# Patient Record
Sex: Female | Born: 1970 | Race: White | Hispanic: No | Marital: Single | Smoking: Never smoker
Health system: Southern US, Community
[De-identification: ages and names within clinical notes are randomized; demographics above are authoritative.]

## PROBLEM LIST (undated history)

## (undated) HISTORY — PX: TONSILLECTOMY AND ADENOIDECTOMY: SUR1326

---

## 2013-08-03 ENCOUNTER — Ambulatory Visit: Payer: Self-pay | Admitting: Family Medicine

## 2013-08-07 ENCOUNTER — Ambulatory Visit: Payer: Self-pay | Admitting: Family Medicine

## 2013-08-21 ENCOUNTER — Other Ambulatory Visit (HOSPITAL_COMMUNITY)
Admission: RE | Admit: 2013-08-21 | Discharge: 2013-08-21 | Disposition: A | Discharge status: Home / Self Care | Payer: BC Managed Care – PPO | Source: Ambulatory Visit | Attending: Family Medicine | Admitting: Family Medicine

## 2013-08-21 ENCOUNTER — Encounter: Payer: Self-pay | Admitting: Family Medicine

## 2013-08-21 ENCOUNTER — Ambulatory Visit (INDEPENDENT_AMBULATORY_CARE_PROVIDER_SITE_OTHER): Payer: BC Managed Care – PPO | Admitting: Family Medicine

## 2013-08-21 VITALS — BP 104/66 | HR 66 | Temp 98.3°F | Ht 60.0 in | Wt 136.6 lb

## 2013-08-21 DIAGNOSIS — Z124 Encounter for screening for malignant neoplasm of cervix: Secondary | ICD-10-CM

## 2013-08-21 DIAGNOSIS — Z1239 Encounter for other screening for malignant neoplasm of breast: Secondary | ICD-10-CM

## 2013-08-21 DIAGNOSIS — Z1151 Encounter for screening for human papillomavirus (HPV): Secondary | ICD-10-CM | POA: Insufficient documentation

## 2013-08-21 DIAGNOSIS — Z0000 Encounter for general adult medical examination without abnormal findings: Secondary | ICD-10-CM

## 2013-08-21 DIAGNOSIS — Z23 Encounter for immunization: Secondary | ICD-10-CM

## 2013-08-21 NOTE — Progress Notes (Signed)
Pre visit review using our clinic review tool, if applicable. No additional management support is needed unless otherwise documented below in the visit note. 

## 2013-08-21 NOTE — Progress Notes (Signed)
Subjective:     Julie Duarte is a 43 y.o. female and is here for a comprehensive physical exam. The patient reports problems - R knee pain-- no known injury.  History   Social History  . Marital Status: Single    Spouse Name: N/A    Number of Children: N/A  . Years of Education: N/A   Occupational History  .      [employer] agency--- [employer]   Social History Main Topics  . Smoking status: Never Smoker   . Smokeless tobacco: Not on file  . Alcohol Use: Yes     Comment: rare  . Drug Use: No  . Sexual Activity: Yes    Partners: Male    Birth Control/ Protection: None   Other Topics Concern  . Not on file   Social History Narrative   Exercise--  Walks dog,   There are no preventive care reminders to display for this patient.  The following portions of the patient's history were reviewed and updated as appropriate:  She  has no past medical history on file. She  does not have a problem list on file. She  has past surgical history that includes Tonsillectomy and adenoidectomy. Her family history includes Cancer (age of onset: 19) in her maternal grandmother. She  reports that she has never smoked. She does not have any smokeless tobacco history on file. She reports that she drinks alcohol. She reports that she does not use illicit drugs. She currently has no medications in their medication list. No current outpatient prescriptions on file prior to visit.   No current facility-administered medications on file prior to visit.   She has No Known Allergies..  Review of Systems Review of Systems  Constitutional: Negative for activity change, appetite change and fatigue.  HENT: Negative for hearing loss, congestion, tinnitus and ear discharge.  dentist q79m Eyes: Negative for visual disturbance (see optho q1y -- vision corrected to 20/20 with glasses).  Respiratory: Negative for cough, chest tightness and shortness of breath.   Cardiovascular: Negative for chest pain,  palpitations and leg swelling.  Gastrointestinal: Negative for abdominal pain, diarrhea, constipation and abdominal distention.  Genitourinary: Negative for urgency, frequency, decreased urine volume and difficulty urinating.  Musculoskeletal: Negative for back pain, arthralgias and gait problem.  Skin: Negative for color change, pallor and rash.  Neurological: Negative for dizziness, light-headedness, numbness and headaches.  Hematological: Negative for adenopathy. Does not bruise/bleed easily.  Psychiatric/Behavioral: Negative for suicidal ideas, confusion, sleep disturbance, self-injury, dysphoric mood, decreased concentration and agitation.       Objective:    BP 104/66  Pulse 66  Temp(Src) 98.3 F (36.8 C) (Oral)  Ht 5' (1.524 m)  Wt 136 lb 9.6 oz (61.961 kg)  BMI 26.68 kg/m2  SpO2 97% General appearance: alert, cooperative, appears stated age and no distress Head: Normocephalic, without obvious abnormality, atraumatic Eyes: conjunctivae/corneas clear. PERRL, EOM's intact. Fundi benign. Ears: normal TM&#39;s and external ear canals both ears Nose: Nares normal. Septum midline. Mucosa normal. No drainage or sinus tenderness. Throat: lips, mucosa, and tongue normal; teeth and gums normal Neck: no adenopathy, no carotid bruit, no JVD, supple, symmetrical, trachea midline and thyroid not enlarged, symmetric, no tenderness/mass/nodules Back: symmetric, no curvature. ROM normal. No CVA tenderness. Lungs: clear to auscultation bilaterally Breasts: normal appearance, no masses or tenderness Heart: regular rate and rhythm, S1, S2 normal, no murmur, click, rub or gallop Abdomen: soft, non-tender; bowel sounds normal; no masses,  no organomegaly Pelvic: cervix  normal in appearance, external genitalia normal, no adnexal masses or tenderness, no cervical motion tenderness, rectovaginal septum normal, uterus normal size, shape, and consistency, vagina normal without discharge and pap done--  + on period Rectal-- heme neg brown stool Extremities: extremities normal, atraumatic, no cyanosis or edema Pulses: 2+ and symmetric Skin: Skin color, texture, turgor normal. No rashes or lesions Lymph nodes: Cervical, supraclavicular, and axillary nodes normal. Neurologic: Alert and oriented X 3, normal strength and tone. Normal symmetric reflexes. Normal coordination and gait Psych-- no depression, no anxiety Assessment:    Healthy female exam.      Plan:    ghm utd Check labs See After Visit Summary for Counseling Recommendations   1. Other screening breast examination  - MM DIGITAL SCREENING BILATERAL; Future  2. Preventative health care colonoscopy - Ambulatory referral to Gastroenterology Labs ordered for future

## 2013-08-21 NOTE — Patient Instructions (Signed)
Preventive Care for Adults A healthy lifestyle and preventive care can promote health and wellness. Preventive health guidelines for women include the following key practices.  A routine yearly physical is a good way to check with your health care provider about your health and preventive screening. It is a chance to share any concerns and updates on your health and to receive a thorough exam.  Visit your dentist for a routine exam and preventive care every 6 months. Brush your teeth twice a day and floss once a day. Good oral hygiene prevents tooth decay and gum disease.  The frequency of eye exams is based on your age, health, family medical history, use of contact lenses, and other factors. Follow your health care provider's recommendations for frequency of eye exams.  Eat a healthy diet. Foods like vegetables, fruits, whole grains, low-fat dairy products, and lean protein foods contain the nutrients you need without too many calories. Decrease your intake of foods high in solid fats, added sugars, and salt. Eat the right amount of calories for you.Get information about a proper diet from your health care provider, if necessary.  Regular physical exercise is one of the most important things you can do for your health. Most adults should get at least 150 minutes of moderate-intensity exercise (any activity that increases your heart rate and causes you to sweat) each week. In addition, most adults need muscle-strengthening exercises on 2 or more days a week.  Maintain a healthy weight. The body mass index (BMI) is a screening tool to identify possible weight problems. It provides an estimate of body fat based on height and weight. Your health care provider can find your BMI and can help you achieve or maintain a healthy weight.For adults 20 years and older:  A BMI below 18.5 is considered underweight.  A BMI of 18.5 to 24.9 is normal.  A BMI of 25 to 29.9 is considered overweight.  A BMI of  30 and above is considered obese.  Maintain normal blood lipids and cholesterol levels by exercising and minimizing your intake of saturated fat. Eat a balanced diet with plenty of fruit and vegetables. Blood tests for lipids and cholesterol should begin at age 76 and be repeated every 5 years. If your lipid or cholesterol levels are high, you are over 50, or you are at high risk for heart disease, you may need your cholesterol levels checked more frequently.Ongoing high lipid and cholesterol levels should be treated with medicines if diet and exercise are not working.  If you smoke, find out from your health care provider how to quit. If you do not use tobacco, do not start.  Lung cancer screening is recommended for adults aged 22-80 years who are at high risk for developing lung cancer because of a history of smoking. A yearly low-dose CT scan of the lungs is recommended for people who have at least a 30-pack-year history of smoking and are a current smoker or have quit within the past 15 years. A pack year of smoking is smoking an average of 1 pack of cigarettes a day for 1 year (for example: 1 pack a day for 30 years or 2 packs a day for 15 years). Yearly screening should continue until the smoker has stopped smoking for at least 15 years. Yearly screening should be stopped for people who develop a health problem that would prevent them from having lung cancer treatment.  If you are pregnant, do not drink alcohol. If you are breastfeeding,  be very cautious about drinking alcohol. If you are not pregnant and choose to drink alcohol, do not have more than 1 drink per day. One drink is considered to be 12 ounces (355 mL) of beer, 5 ounces (148 mL) of wine, or 1.5 ounces (44 mL) of liquor.  Avoid use of street drugs. Do not share needles with anyone. Ask for help if you need support or instructions about stopping the use of drugs.  High blood pressure causes heart disease and increases the risk of  stroke. Your blood pressure should be checked at least every 1 to 2 years. Ongoing high blood pressure should be treated with medicines if weight loss and exercise do not work.  If you are 75-52 years old, ask your health care provider if you should take aspirin to prevent strokes.  Diabetes screening involves taking a blood sample to check your fasting blood sugar level. This should be done once every 3 years, after age 15, if you are within normal weight and without risk factors for diabetes. Testing should be considered at a younger age or be carried out more frequently if you are overweight and have at least 1 risk factor for diabetes.  Breast cancer screening is essential preventive care for women. You should practice "breast self-awareness." This means understanding the normal appearance and feel of your breasts and may include breast self-examination. Any changes detected, no matter how small, should be reported to a health care provider. Women in their 58s and 30s should have a clinical breast exam (CBE) by a health care provider as part of a regular health exam every 1 to 3 years. After age 16, women should have a CBE every year. Starting at age 53, women should consider having a mammogram (breast X-ray test) every year. Women who have a family history of breast cancer should talk to their health care provider about genetic screening. Women at a high risk of breast cancer should talk to their health care providers about having an MRI and a mammogram every year.  Breast cancer gene (BRCA)-related cancer risk assessment is recommended for women who have family members with BRCA-related cancers. BRCA-related cancers include breast, ovarian, tubal, and peritoneal cancers. Having family members with these cancers may be associated with an increased risk for harmful changes (mutations) in the breast cancer genes BRCA1 and BRCA2. Results of the assessment will determine the need for genetic counseling and  BRCA1 and BRCA2 testing.  Routine pelvic exams to screen for cancer are no longer recommended for nonpregnant women who are considered low risk for cancer of the pelvic organs (ovaries, uterus, and vagina) and who do not have symptoms. Ask your health care provider if a screening pelvic exam is right for you.  If you have had past treatment for cervical cancer or a condition that could lead to cancer, you need Pap tests and screening for cancer for at least 20 years after your treatment. If Pap tests have been discontinued, your risk factors (such as having a new sexual partner) need to be reassessed to determine if screening should be resumed. Some women have medical problems that increase the chance of getting cervical cancer. In these cases, your health care provider may recommend more frequent screening and Pap tests.  The HPV test is an additional test that may be used for cervical cancer screening. The HPV test looks for the virus that can cause the cell changes on the cervix. The cells collected during the Pap test can be  tested for HPV. The HPV test could be used to screen women aged 70 years and older, and should be used in women of any age who have unclear Pap test results. After the age of 77, women should have HPV testing at the same frequency as a Pap test.  Colorectal cancer can be detected and often prevented. Most routine colorectal cancer screening begins at the age of 13 years and continues through age 47 years. However, your health care provider may recommend screening at an earlier age if you have risk factors for colon cancer. On a yearly basis, your health care provider may provide home test kits to check for hidden blood in the stool. Use of a small camera at the end of a tube, to directly examine the colon (sigmoidoscopy or colonoscopy), can detect the earliest forms of colorectal cancer. Talk to your health care provider about this at age 45, when routine screening begins. Direct  exam of the colon should be repeated every 5-10 years through age 58 years, unless early forms of pre-cancerous polyps or small growths are found.  People who are at an increased risk for hepatitis B should be screened for this virus. You are considered at high risk for hepatitis B if:  You were born in a country where hepatitis B occurs often. Talk with your health care provider about which countries are considered high risk.  Your parents were born in a high-risk country and you have not received a shot to protect against hepatitis B (hepatitis B vaccine).  You have HIV or AIDS.  You use needles to inject street drugs.  You live with, or have sex with, someone who has hepatitis B.  You get hemodialysis treatment.  You take certain medicines for conditions like cancer, organ transplantation, and autoimmune conditions.  Hepatitis C blood testing is recommended for all people born from [birth year] through 1965 and any individual with known risks for hepatitis C.  Practice safe sex. Use condoms and avoid high-risk sexual practices to reduce the spread of sexually transmitted infections (STIs). STIs include gonorrhea, chlamydia, syphilis, trichomonas, herpes, HPV, and human immunodeficiency virus (HIV). Herpes, HIV, and HPV are viral illnesses that have no cure. They can result in disability, cancer, and death.  You should be screened for sexually transmitted illnesses (STIs) including gonorrhea and chlamydia if:  You are sexually active and are younger than 24 years.  You are older than 24 years and your health care provider tells you that you are at risk for this type of infection.  Your sexual activity has changed since you were last screened and you are at an increased risk for chlamydia or gonorrhea. Ask your health care provider if you are at risk.  If you are at risk of being infected with HIV, it is recommended that you take a prescription medicine daily to prevent HIV infection. This is  called preexposure prophylaxis (PrEP). You are considered at risk if:  You are a heterosexual woman, are sexually active, and are at increased risk for HIV infection.  You take drugs by injection.  You are sexually active with a partner who has HIV.  Talk with your health care provider about whether you are at high risk of being infected with HIV. If you choose to begin PrEP, you should first be tested for HIV. You should then be tested every 3 months for as long as you are taking PrEP.  Osteoporosis is a disease in which the bones lose minerals and strength  with aging. This can result in serious bone fractures or breaks. The risk of osteoporosis can be identified using a bone density scan. Women ages 65 years and over and women at risk for fractures or osteoporosis should discuss screening with their health care providers. Ask your health care provider whether you should take a calcium supplement or vitamin D to reduce the rate of osteoporosis.  Menopause can be associated with physical symptoms and risks. Hormone replacement therapy is available to decrease symptoms and risks. You should talk to your health care provider about whether hormone replacement therapy is right for you.  Use sunscreen. Apply sunscreen liberally and repeatedly throughout the day. You should seek shade when your shadow is shorter than you. Protect yourself by wearing long sleeves, pants, a wide-brimmed hat, and sunglasses year round, whenever you are outdoors.  Once a month, do a whole body skin exam, using a mirror to look at the skin on your back. Tell your health care provider of new moles, moles that have irregular borders, moles that are larger than a pencil eraser, or moles that have changed in shape or color.  Stay current with required vaccines (immunizations).  Influenza vaccine. All adults should be immunized every year.  Tetanus, diphtheria, and acellular pertussis (Td, Tdap) vaccine. Pregnant women should  receive 1 dose of Tdap vaccine during each pregnancy. The dose should be obtained regardless of the length of time since the last dose. Immunization is preferred during the 27th-36th week of gestation. An adult who has not previously received Tdap or who does not know her vaccine status should receive 1 dose of Tdap. This initial dose should be followed by tetanus and diphtheria toxoids (Td) booster doses every 10 years. Adults with an unknown or incomplete history of completing a 3-dose immunization series with Td-containing vaccines should begin or complete a primary immunization series including a Tdap dose. Adults should receive a Td booster every 10 years.  Varicella vaccine. An adult without evidence of immunity to varicella should receive 2 doses or a second dose if she has previously received 1 dose. Pregnant females who do not have evidence of immunity should receive the first dose after pregnancy. This first dose should be obtained before leaving the health care facility. The second dose should be obtained 4-8 weeks after the first dose.  Human papillomavirus (HPV) vaccine. Females aged 13-26 years who have not received the vaccine previously should obtain the 3-dose series. The vaccine is not recommended for use in pregnant females. However, pregnancy testing is not needed before receiving a dose. If a female is found to be pregnant after receiving a dose, no treatment is needed. In that case, the remaining doses should be delayed until after the pregnancy. Immunization is recommended for any person with an immunocompromised condition through the age of 26 years if she did not get any or all doses earlier. During the 3-dose series, the second dose should be obtained 4-8 weeks after the first dose. The third dose should be obtained 24 weeks after the first dose and 16 weeks after the second dose.  Zoster vaccine. One dose is recommended for adults aged 60 years or older unless certain conditions are  present.  Measles, mumps, and rubella (MMR) vaccine. Adults born before 1957 generally are considered immune to measles and mumps. Adults born in 1957 or later should have 1 or more doses of MMR vaccine unless there is a contraindication to the vaccine or there is laboratory evidence of immunity to   each of the three diseases. A routine second dose of MMR vaccine should be obtained at least 28 days after the first dose for students attending postsecondary schools, health care workers, or international travelers. People who received inactivated measles vaccine or an unknown type of measles vaccine during 1963-1967 should receive 2 doses of MMR vaccine. People who received inactivated mumps vaccine or an unknown type of mumps vaccine before 1979 and are at high risk for mumps infection should consider immunization with 2 doses of MMR vaccine. For females of childbearing age, rubella immunity should be determined. If there is no evidence of immunity, females who are not pregnant should be vaccinated. If there is no evidence of immunity, females who are pregnant should delay immunization until after pregnancy. Unvaccinated health care workers born before 1957 who lack laboratory evidence of measles, mumps, or rubella immunity or laboratory confirmation of disease should consider measles and mumps immunization with 2 doses of MMR vaccine or rubella immunization with 1 dose of MMR vaccine.  Pneumococcal 13-valent conjugate (PCV13) vaccine. When indicated, a person who is uncertain of her immunization history and has no record of immunization should receive the PCV13 vaccine. An adult aged 19 years or older who has certain medical conditions and has not been previously immunized should receive 1 dose of PCV13 vaccine. This PCV13 should be followed with a dose of pneumococcal polysaccharide (PPSV23) vaccine. The PPSV23 vaccine dose should be obtained at least 8 weeks after the dose of PCV13 vaccine. An adult aged 19  years or older who has certain medical conditions and previously received 1 or more doses of PPSV23 vaccine should receive 1 dose of PCV13. The PCV13 vaccine dose should be obtained 1 or more years after the last PPSV23 vaccine dose.  Pneumococcal polysaccharide (PPSV23) vaccine. When PCV13 is also indicated, PCV13 should be obtained first. All adults aged 65 years and older should be immunized. An adult younger than age 65 years who has certain medical conditions should be immunized. Any person who resides in a nursing home or long-term care facility should be immunized. An adult smoker should be immunized. People with an immunocompromised condition and certain other conditions should receive both PCV13 and PPSV23 vaccines. People with human immunodeficiency virus (HIV) infection should be immunized as soon as possible after diagnosis. Immunization during chemotherapy or radiation therapy should be avoided. Routine use of PPSV23 vaccine is not recommended for American Indians, [place] Natives, or people younger than 65 years unless there are medical conditions that require PPSV23 vaccine. When indicated, people who have unknown immunization and have no record of immunization should receive PPSV23 vaccine. One-time revaccination 5 years after the first dose of PPSV23 is recommended for people aged 19-64 years who have chronic kidney failure, nephrotic syndrome, asplenia, or immunocompromised conditions. People who received 1-2 doses of PPSV23 before age 65 years should receive another dose of PPSV23 vaccine at age 65 years or later if at least 5 years have passed since the previous dose. Doses of PPSV23 are not needed for people immunized with PPSV23 at or after age 65 years.  Meningococcal vaccine. Adults with asplenia or persistent complement component deficiencies should receive 2 doses of quadrivalent meningococcal conjugate (MenACWY-D) vaccine. The doses should be obtained at least 2 months apart.  Microbiologists working with certain meningococcal bacteria, military recruits, people at risk during an outbreak, and people who travel to or live in countries with a high rate of meningitis should be immunized. A first-year college student up through age   21 years who is living in a residence hall should receive a dose if she did not receive a dose on or after her 16th birthday. Adults who have certain high-risk conditions should receive one or more doses of vaccine.  Hepatitis A vaccine. Adults who wish to be protected from this disease, have certain high-risk conditions, work with hepatitis A-infected animals, work in hepatitis A research labs, or travel to or work in countries with a high rate of hepatitis A should be immunized. Adults who were previously unvaccinated and who anticipate close contact with an international adoptee during the first 60 days after arrival in the [place] from a country with a high rate of hepatitis A should be immunized.  Hepatitis B vaccine. Adults who wish to be protected from this disease, have certain high-risk conditions, may be exposed to blood or other infectious body fluids, are household contacts or sex partners of hepatitis B positive people, are clients or workers in certain care facilities, or travel to or work in countries with a high rate of hepatitis B should be immunized.  Haemophilus influenzae type b (Hib) vaccine. A previously unvaccinated person with asplenia or sickle cell disease or having a scheduled splenectomy should receive 1 dose of Hib vaccine. Regardless of previous immunization, a recipient of a hematopoietic stem cell transplant should receive a 3-dose series 6-12 months after her successful transplant. Hib vaccine is not recommended for adults with HIV infection. Preventive Services / Frequency Ages 43 to 58 years  Blood pressure check.** / Every 1 to 2 years.  Lipid and cholesterol check.** / Every 5 years beginning at age  51.  Clinical breast exam.** / Every 3 years for women in their 110s and 86s.  BRCA-related cancer risk assessment.** / For women who have family members with a BRCA-related cancer (breast, ovarian, tubal, or peritoneal cancers).  Pap test.** / Every 2 years from ages 6 through 43. Every 3 years starting at age 68 through age 50 or 48 with a history of 3 consecutive normal Pap tests.  HPV screening.** / Every 3 years from ages 30 through ages 57 to 110 with a history of 3 consecutive normal Pap tests.  Hepatitis C blood test.** / For any individual with known risks for hepatitis C.  Skin self-exam. / Monthly.  Influenza vaccine. / Every year.  Tetanus, diphtheria, and acellular pertussis (Tdap, Td) vaccine.** / Consult your health care provider. Pregnant women should receive 1 dose of Tdap vaccine during each pregnancy. 1 dose of Td every 10 years.  Varicella vaccine.** / Consult your health care provider. Pregnant females who do not have evidence of immunity should receive the first dose after pregnancy.  HPV vaccine. / 3 doses over 6 months, if 75 and younger. The vaccine is not recommended for use in pregnant females. However, pregnancy testing is not needed before receiving a dose.  Measles, mumps, rubella (MMR) vaccine.** / You need at least 1 dose of MMR if you were born in 1957 or later. You may also need a 2nd dose. For females of childbearing age, rubella immunity should be determined. If there is no evidence of immunity, females who are not pregnant should be vaccinated. If there is no evidence of immunity, females who are pregnant should delay immunization until after pregnancy.  Pneumococcal 13-valent conjugate (PCV13) vaccine.** / Consult your health care provider.  Pneumococcal polysaccharide (PPSV23) vaccine.** / 1 to 2 doses if you smoke cigarettes or if you have certain conditions.  Meningococcal vaccine.** /  1 dose if you are age 19 to 21 years and a first-year college  student living in a residence hall, or have one of several medical conditions, you need to get vaccinated against meningococcal disease. You may also need additional booster doses.  Hepatitis A vaccine.** / Consult your health care provider.  Hepatitis B vaccine.** / Consult your health care provider.  Haemophilus influenzae type b (Hib) vaccine.** / Consult your health care provider. Ages 40 to 64 years  Blood pressure check.** / Every 1 to 2 years.  Lipid and cholesterol check.** / Every 5 years beginning at age 20 years.  Lung cancer screening. / Every year if you are aged 55-80 years and have a 30-pack-year history of smoking and currently smoke or have quit within the past 15 years. Yearly screening is stopped once you have quit smoking for at least 15 years or develop a health problem that would prevent you from having lung cancer treatment.  Clinical breast exam.** / Every year after age 40 years.  BRCA-related cancer risk assessment.** / For women who have family members with a BRCA-related cancer (breast, ovarian, tubal, or peritoneal cancers).  Mammogram.** / Every year beginning at age 40 years and continuing for as long as you are in good health. Consult with your health care provider.  Pap test.** / Every 3 years starting at age 30 years through age 65 or 70 years with a history of 3 consecutive normal Pap tests.  HPV screening.** / Every 3 years from ages 30 years through ages 65 to 70 years with a history of 3 consecutive normal Pap tests.  Fecal occult blood test (FOBT) of stool. / Every year beginning at age 50 years and continuing until age 75 years. You may not need to do this test if you get a colonoscopy every 10 years.  Flexible sigmoidoscopy or colonoscopy.** / Every 5 years for a flexible sigmoidoscopy or every 10 years for a colonoscopy beginning at age 50 years and continuing until age 75 years.  Hepatitis C blood test.** / For all people born from 1945 through  1965 and any individual with known risks for hepatitis C.  Skin self-exam. / Monthly.  Influenza vaccine. / Every year.  Tetanus, diphtheria, and acellular pertussis (Tdap/Td) vaccine.** / Consult your health care provider. Pregnant women should receive 1 dose of Tdap vaccine during each pregnancy. 1 dose of Td every 10 years.  Varicella vaccine.** / Consult your health care provider. Pregnant females who do not have evidence of immunity should receive the first dose after pregnancy.  Zoster vaccine.** / 1 dose for adults aged 60 years or older.  Measles, mumps, rubella (MMR) vaccine.** / You need at least 1 dose of MMR if you were born in 1957 or later. You may also need a 2nd dose. For females of childbearing age, rubella immunity should be determined. If there is no evidence of immunity, females who are not pregnant should be vaccinated. If there is no evidence of immunity, females who are pregnant should delay immunization until after pregnancy.  Pneumococcal 13-valent conjugate (PCV13) vaccine.** / Consult your health care provider.  Pneumococcal polysaccharide (PPSV23) vaccine.** / 1 to 2 doses if you smoke cigarettes or if you have certain conditions.  Meningococcal vaccine.** / Consult your health care provider.  Hepatitis A vaccine.** / Consult your health care provider.  Hepatitis B vaccine.** / Consult your health care provider.  Haemophilus influenzae type b (Hib) vaccine.** / Consult your health care provider. Ages 65   years and over  Blood pressure check.** / Every 1 to 2 years.  Lipid and cholesterol check.** / Every 5 years beginning at age 35 years.  Lung cancer screening. / Every year if you are aged 37-80 years and have a 30-pack-year history of smoking and currently smoke or have quit within the past 15 years. Yearly screening is stopped once you have quit smoking for at least 15 years or develop a health problem that would prevent you from having lung cancer  treatment.  Clinical breast exam.** / Every year after age 18 years.  BRCA-related cancer risk assessment.** / For women who have family members with a BRCA-related cancer (breast, ovarian, tubal, or peritoneal cancers).  Mammogram.** / Every year beginning at age 47 years and continuing for as long as you are in good health. Consult with your health care provider.  Pap test.** / Every 3 years starting at age 63 years through age 85 or 75 years with 3 consecutive normal Pap tests. Testing can be stopped between 65 and 70 years with 3 consecutive normal Pap tests and no abnormal Pap or HPV tests in the past 10 years.  HPV screening.** / Every 3 years from ages 69 years through ages 16 or 53 years with a history of 3 consecutive normal Pap tests. Testing can be stopped between 65 and 70 years with 3 consecutive normal Pap tests and no abnormal Pap or HPV tests in the past 10 years.  Fecal occult blood test (FOBT) of stool. / Every year beginning at age 22 years and continuing until age 36 years. You may not need to do this test if you get a colonoscopy every 10 years.  Flexible sigmoidoscopy or colonoscopy.** / Every 5 years for a flexible sigmoidoscopy or every 10 years for a colonoscopy beginning at age 61 years and continuing until age 53 years.  Hepatitis C blood test.** / For all people born from [birth year] through 1965 and any individual with known risks for hepatitis C.  Osteoporosis screening.** / A one-time screening for women ages 6 years and over and women at risk for fractures or osteoporosis.  Skin self-exam. / Monthly.  Influenza vaccine. / Every year.  Tetanus, diphtheria, and acellular pertussis (Tdap/Td) vaccine.** / 1 dose of Td every 10 years.  Varicella vaccine.** / Consult your health care provider.  Zoster vaccine.** / 1 dose for adults aged 82 years or older.  Pneumococcal 13-valent conjugate (PCV13) vaccine.** / Consult your health care provider.  Pneumococcal  polysaccharide (PPSV23) vaccine.** / 1 dose for all adults aged 85 years and older.  Meningococcal vaccine.** / Consult your health care provider.  Hepatitis A vaccine.** / Consult your health care provider.  Hepatitis B vaccine.** / Consult your health care provider.  Haemophilus influenzae type b (Hib) vaccine.** / Consult your health care provider. ** Family history and personal history of risk and conditions may change your health care provider's recommendations. Document Released: 03/03/2001 Document Revised: 05/22/2013 Document Reviewed: 06/02/2010 [facility] Patient Information 2015 [place], [place]. This information is not intended to replace advice given to you by your health care provider. Make sure you discuss any questions you have with your health care provider.

## 2013-08-24 LAB — CYTOLOGY - PAP

## 2013-08-25 ENCOUNTER — Other Ambulatory Visit (INDEPENDENT_AMBULATORY_CARE_PROVIDER_SITE_OTHER): Payer: BC Managed Care – PPO

## 2013-08-25 DIAGNOSIS — Z1239 Encounter for other screening for malignant neoplasm of breast: Secondary | ICD-10-CM

## 2013-08-25 DIAGNOSIS — Z0000 Encounter for general adult medical examination without abnormal findings: Secondary | ICD-10-CM

## 2013-08-25 LAB — POCT URINALYSIS DIPSTICK
Bilirubin, UA: NEGATIVE
Blood, UA: NEGATIVE
Glucose, UA: NEGATIVE
KETONES UA: NEGATIVE
LEUKOCYTES UA: NEGATIVE
NITRITE UA: NEGATIVE
PH UA: 6
Protein, UA: NEGATIVE
Spec Grav, UA: <=1.005
Urobilinogen, UA: 0.2

## 2013-08-25 LAB — CBC WITH DIFFERENTIAL/PLATELET
BASOS ABS: 0 10*3/uL (ref 0.0–0.1)
Basophils Relative: 0.8 % (ref 0.0–3.0)
EOS PCT: 3.8 % (ref 0.0–5.0)
Eosinophils Absolute: 0.2 10*3/uL (ref 0.0–0.7)
HEMATOCRIT: 37.3 % (ref 36.0–46.0)
HEMOGLOBIN: 12.6 g/dL (ref 12.0–15.0)
LYMPHS ABS: 1.7 10*3/uL (ref 0.7–4.0)
Lymphocytes Relative: 31.2 % (ref 12.0–46.0)
MCHC: 33.9 g/dL (ref 30.0–36.0)
MCV: 93 fl (ref 78.0–100.0)
Monocytes Absolute: 0.6 10*3/uL (ref 0.1–1.0)
Monocytes Relative: 11.6 % (ref 3.0–12.0)
NEUTROS PCT: 52.6 % (ref 43.0–77.0)
Neutro Abs: 2.8 10*3/uL (ref 1.4–7.7)
PLATELETS: 347 10*3/uL (ref 150.0–400.0)
RBC: 4.01 Mil/uL (ref 3.87–5.11)
RDW: 13.9 % (ref 11.5–15.5)
WBC: 5.3 10*3/uL (ref 4.0–10.5)

## 2013-08-25 LAB — LIPID PANEL
CHOLESTEROL: 185 mg/dL (ref 0–200)
HDL: 57.8 mg/dL (ref >39.00)
LDL Cholesterol: 117 mg/dL — ABNORMAL HIGH (ref 0–99)
NonHDL: 127.2
TRIGLYCERIDES: 51 mg/dL (ref 0.0–149.0)
Total CHOL/HDL Ratio: 3
VLDL: 10.2 mg/dL (ref 0.0–40.0)

## 2013-08-25 LAB — BASIC METABOLIC PANEL
BUN: 12 mg/dL (ref 6–23)
CALCIUM: 8.9 mg/dL (ref 8.4–10.5)
CO2: 28 meq/L (ref 19–32)
Chloride: 103 mEq/L (ref 96–112)
Creatinine, Ser: 0.8 mg/dL (ref 0.4–1.2)
GFR: 84.76 mL/min (ref >60.00)
GLUCOSE: 81 mg/dL (ref 70–99)
Potassium: 3.7 mEq/L (ref 3.5–5.1)
Sodium: 135 mEq/L (ref 135–145)

## 2013-08-25 LAB — HEPATIC FUNCTION PANEL
ALBUMIN: 3.8 g/dL (ref 3.5–5.2)
ALT: 10 U/L (ref 0–35)
AST: 16 U/L (ref 0–37)
Alkaline Phosphatase: 44 U/L (ref 39–117)
Bilirubin, Direct: 0 mg/dL (ref 0.0–0.3)
TOTAL PROTEIN: 6.7 g/dL (ref 6.0–8.3)
Total Bilirubin: 0.3 mg/dL (ref 0.2–1.2)

## 2013-08-25 LAB — TSH: TSH: 1.22 u[IU]/mL (ref 0.35–4.50)

## 2013-08-30 ENCOUNTER — Ambulatory Visit (HOSPITAL_BASED_OUTPATIENT_CLINIC_OR_DEPARTMENT_OTHER): Payer: Self-pay

## 2013-11-03 ENCOUNTER — Encounter: Payer: Self-pay | Admitting: Family Medicine

## 2013-11-07 ENCOUNTER — Encounter: Payer: Self-pay | Admitting: Family Medicine

## 2013-12-20 ENCOUNTER — Inpatient Hospital Stay (HOSPITAL_BASED_OUTPATIENT_CLINIC_OR_DEPARTMENT_OTHER): Admission: RE | Admit: 2013-12-20 | Payer: Self-pay | Source: Ambulatory Visit

## 2014-01-23 ENCOUNTER — Ambulatory Visit (HOSPITAL_BASED_OUTPATIENT_CLINIC_OR_DEPARTMENT_OTHER)
Admission: RE | Admit: 2014-01-23 | Discharge: 2014-01-23 | Disposition: A | Discharge status: Home / Self Care | Payer: BLUE CROSS/BLUE SHIELD | Source: Ambulatory Visit | Attending: Family Medicine | Admitting: Family Medicine

## 2014-01-23 ENCOUNTER — Ambulatory Visit (HOSPITAL_BASED_OUTPATIENT_CLINIC_OR_DEPARTMENT_OTHER): Payer: Self-pay

## 2014-01-23 DIAGNOSIS — Z1231 Encounter for screening mammogram for malignant neoplasm of breast: Secondary | ICD-10-CM | POA: Diagnosis present

## 2014-01-24 ENCOUNTER — Encounter: Payer: Self-pay | Admitting: Gastroenterology

## 2014-02-20 ENCOUNTER — Other Ambulatory Visit: Payer: Self-pay | Admitting: Family Medicine

## 2014-02-20 ENCOUNTER — Telehealth: Payer: Self-pay | Admitting: Family Medicine

## 2014-02-20 ENCOUNTER — Encounter: Payer: Self-pay | Admitting: Family Medicine

## 2014-02-20 DIAGNOSIS — Z3009 Encounter for other general counseling and advice on contraception: Secondary | ICD-10-CM

## 2014-02-20 NOTE — Telephone Encounter (Signed)
Please advise      KP 

## 2014-02-20 NOTE — Telephone Encounter (Signed)
Caller name:Inga, Karly Relation to UL:AGTX Call back number:[phone] to leave message Pharmacy:  Reason for call: pt is needing a referral for an GYN- pt has BCBS, pt is trying to get pregnant and want to see a GYN to find out about what options she has.

## 2014-02-20 NOTE — Telephone Encounter (Signed)
Referral iin

## 2014-03-14 ENCOUNTER — Ambulatory Visit: Payer: BLUE CROSS/BLUE SHIELD | Admitting: Gastroenterology

## 2014-04-23 ENCOUNTER — Encounter: Payer: Self-pay | Admitting: Gastroenterology

## 2014-04-23 ENCOUNTER — Ambulatory Visit (INDEPENDENT_AMBULATORY_CARE_PROVIDER_SITE_OTHER): Payer: BLUE CROSS/BLUE SHIELD | Admitting: Gastroenterology

## 2014-04-23 VITALS — BP 100/68 | HR 68

## 2014-04-23 DIAGNOSIS — Z800 Family history of malignant neoplasm of digestive organs: Secondary | ICD-10-CM | POA: Diagnosis not present

## 2014-04-23 DIAGNOSIS — R1314 Dysphagia, pharyngoesophageal phase: Secondary | ICD-10-CM | POA: Diagnosis not present

## 2014-04-23 MED ORDER — PEG-KCL-NACL-NASULF-NA ASC-C 100 G PO SOLR: 1.0000 | Freq: Once | ORAL | Status: DC

## 2014-04-23 NOTE — Patient Instructions (Addendum)
You will be set up for a colonoscopy for FH of colon cancer, colon polyps. You will be set up for an upper endoscopy for dysphagia.  You have been scheduled for an endoscopy and colonoscopy. Please follow the written instructions given to you at your visit today. Please pick up your prep supplies at the pharmacy within the next 1-3 days. If you use inhalers (even only as needed), please bring them with you on the day of your procedure. Your physician has requested that you go to www.startemmi.com and enter the access code given to you at your visit today. This web site gives a general overview about your procedure. However, you should still follow specific instructions given to you by our office regarding your preparation for the procedure.

## 2014-04-23 NOTE — Progress Notes (Signed)
HPI: This is a very pleasant 44 year old woman     who was referred to me by Rosalita Chessman, DO  to evaluate  intermittent dysphasia, family history of colon cancer  Never had colon cancer screening  Mother had diverticulitis.  Maternal GM had colon cancer; was in her 60swhen diagnosed.  Mother had colon polyps.  Has intermittent loose stools, dietary related usually.  Never blood in her stool.  Overall her weight is up in past year  For at least 20 years, hicups with eating starchy foods.  More recently, feels dypshagia (past 6 months).  Was eating fries or rice and food hung in mid esophagus.  Pretty uncommon pyrosis.  Review of systems: Pertinent positive and negative review of systems were noted in the above HPI section. Complete review of systems was performed and was otherwise normal.    History reviewed. No pertinent past medical history.  Past Surgical History  Procedure Laterality Date  . Tonsillectomy and adenoidectomy      No current outpatient prescriptions on file.   No current facility-administered medications for this visit.    Allergies as of 04/23/2014 - Review Complete 04/23/2014  Allergen Reaction Noted  . Penicillins  04/23/2014  . Quinolones  04/23/2014    Family History  Problem Relation Age of Onset  . Breast cancer Maternal Grandmother 90    breast  . Colon cancer Maternal Grandmother     History   Social History  . Marital Status: Single    Spouse Name: N/A  . Number of Children: N/A  . Years of Education: N/A   Occupational History  .      temp agency--- [employer]   Social History Main Topics  . Smoking status: Never Smoker   . Smokeless tobacco: Never Used  . Alcohol Use: 0.0 oz/week    0 Standard drinks or equivalent per week     Comment: rare  . Drug Use: No  . Sexual Activity:    Partners: Male    Birth Control/ Protection: None   Other Topics Concern  . Not on file   Social History Narrative   Exercise--  Building surveyor,       Physical Exam: BP 100/68 mmHg  Pulse 68  LMP 04/08/2014 (Approximate) Constitutional: generally well-appearing Psychiatric: alert and oriented x3 Eyes: extraocular movements intact Mouth: oral pharynx moist, no lesions Neck: supple no lymphadenopathy Cardiovascular: heart regular rate and rhythm Lungs: clear to auscultation bilaterally Abdomen: soft, nontender, nondistended, no obvious ascites, no peritoneal signs, normal bowel sounds Extremities: no lower extremity edema bilaterally Skin: no lesions on visible extremities    Assessment and plan: 44 y.o. female with  intermittent dysphasia, family history of colon cancer, family history of colon polyps  Her grandmother having colon cancer does not significantly increase her own risk but her mother also has had precancerous colon polyps and so those 2 together I do recommend we do colon cancer screening around now. If no precancerous polyps or colon cancer than output her back into routine risk pool. At the same time I'll proceed with EGD given her intermittent solid food dysphagia. She does not really have any GERD symptoms, pyrosis inside not going to recommend proton pump inhibitor at this time.   Cc: Rosalita Chessman, DO

## 2014-04-27 ENCOUNTER — Encounter: Payer: Self-pay | Admitting: Gastroenterology

## 2014-05-01 ENCOUNTER — Encounter: Payer: Self-pay | Admitting: Gastroenterology

## 2014-05-15 NOTE — Telephone Encounter (Signed)
The miralax instructions have been added to My Chart.  Please let me know if you have any further questions

## 2014-05-29 ENCOUNTER — Other Ambulatory Visit: Payer: Self-pay | Admitting: Gastroenterology

## 2014-05-29 ENCOUNTER — Ambulatory Visit (AMBULATORY_SURGERY_CENTER): Payer: BLUE CROSS/BLUE SHIELD | Admitting: Gastroenterology

## 2014-05-29 ENCOUNTER — Encounter: Payer: Self-pay | Admitting: Gastroenterology

## 2014-05-29 VITALS — BP 100/41 | HR 49 | Temp 97.9°F | Resp 17 | Ht 60.0 in | Wt 136.0 lb

## 2014-05-29 DIAGNOSIS — K219 Gastro-esophageal reflux disease without esophagitis: Secondary | ICD-10-CM | POA: Diagnosis not present

## 2014-05-29 DIAGNOSIS — R1314 Dysphagia, pharyngoesophageal phase: Secondary | ICD-10-CM

## 2014-05-29 DIAGNOSIS — K635 Polyp of colon: Secondary | ICD-10-CM

## 2014-05-29 DIAGNOSIS — Z800 Family history of malignant neoplasm of digestive organs: Secondary | ICD-10-CM

## 2014-05-29 DIAGNOSIS — Z1211 Encounter for screening for malignant neoplasm of colon: Secondary | ICD-10-CM | POA: Diagnosis not present

## 2014-05-29 DIAGNOSIS — D123 Benign neoplasm of transverse colon: Secondary | ICD-10-CM

## 2014-05-29 MED ORDER — SODIUM CHLORIDE 0.9 % IV SOLN: 500.0000 mL | INTRAVENOUS | Status: DC

## 2014-05-29 NOTE — Op Note (Signed)
[place]  [address] [place], [address]   ENDOSCOPY PROCEDURE REPORT  PATIENT: Julie Duarte, Julie Duarte  MR#: [id number] BIRTHDATE: 03-14-1970 , 44  yrs. old GENDER: female ENDOSCOPIST: Milus Banister, MD PROCEDURE DATE:  05/29/2014 PROCEDURE:  EGD, diagnostic ASA CLASS:     Class II INDICATIONS:  dysphagia, intermittent reflux. MEDICATIONS: Monitored anesthesia care and Propofol 100 mg IV TOPICAL ANESTHETIC: none  DESCRIPTION OF PROCEDURE: After the risks benefits and alternatives of the procedure were thoroughly explained, informed consent was obtained.  The LB [id number] endoscope was introduced through the mouth and advanced to the second portion of the duodenum , Without limitations.  The instrument was slowly withdrawn as the mucosa was fully examined.   EXAM: The esophagus and gastroesophageal junction were completely normal in appearance.  The stomach was entered and closely examined.The antrum, angularis, and lesser curvature were well visualized, including a retroflexed view of the cardia and fundus. The stomach wall was normally distensable.  The scope passed easily through the pylorus into the duodenum.  Retroflexed views revealed no abnormalities.     The scope was then withdrawn from the patient and the procedure completed. COMPLICATIONS: There were no immediate complications.  ENDOSCOPIC IMPRESSION: Normal appearing esophagus and GE junction, the stomach was well visualized and normal in appearance, normal appearing duodenum  RECOMMENDATIONS: Please start once daily OTC omeprazole and call my office in 5-6 weeks to report on your response.   eSigned:  Milus Banister, MD 05/29/2014 2:08 PM   cc: Garnet Koyanagi, MD

## 2014-05-29 NOTE — Progress Notes (Signed)
Patient awakening,vss,report to rn 

## 2014-05-29 NOTE — Progress Notes (Signed)
Called to room to assist during endoscopic procedure.  Patient ID and intended procedure confirmed with present staff. Received instructions for my participation in the procedure from the performing physician.  

## 2014-05-29 NOTE — Op Note (Signed)
[place]  [address]. [place], [address]   COLONOSCOPY PROCEDURE REPORT  PATIENT: Julie Duarte, Julie Duarte  MR#: [id number] BIRTHDATE: November 30, 1970 , 44  yrs. old GENDER: female ENDOSCOPIST: Milus Banister, MD REFERRED SV:[id number] Osgood, DO PROCEDURE DATE:  05/29/2014 PROCEDURE:   Colonoscopy, screening and Colonoscopy with snare polypectomy First Screening Colonoscopy - Avg.  risk and is 50 yrs.  old or older Yes.  Prior Negative Screening - Now for repeat screening. N/A  History of Adenoma - Now for follow-up colonoscopy & has been > or = to 3 yrs.  N/A ASA CLASS:   Class II INDICATIONS:Screening for colonic neoplasia and FH Colon Adenoma (MGM with colon cancer, M with colon polyps). MEDICATIONS: Monitored anesthesia care and Propofol 200 mg IV  DESCRIPTION OF PROCEDURE:   After the risks benefits and alternatives of the procedure were thoroughly explained, informed consent was obtained.  The digital rectal exam revealed no abnormalities of the rectum.   The LB [id number]  endoscope was introduced through the anus and advanced to the cecum, which was identified by both the appendix and ileocecal valve. No adverse events experienced.   The quality of the prep was excellent.  The instrument was then slowly withdrawn as the colon was fully examined.   COLON FINDINGS: A sessile polyp measuring 4 mm in size was found in the transverse colon.  A polypectomy was performed with a cold snare.  The resection was complete, the polyp tissue was completely retrieved and sent to histology.   The examination was otherwise normal.  Retroflexed views revealed no abnormalities. The time to cecum = 2.9      Withdrawal time = 5.9        The scope was withdrawn and the procedure completed. COMPLICATIONS: There were no immediate complications.  ENDOSCOPIC IMPRESSION: 1.   Sessile polyp was found in the transverse colon; polypectomy was performed with a cold snare 2.   The  examination was otherwise normal  RECOMMENDATIONS: If the polyp(s) removed today are proven to be adenomatous (pre-cancerous) polyps, you will need a repeat colonoscopy in 5 years.  Otherwise you should continue to follow colorectal cancer screening guidelines for "routine risk" patients with colonoscopy in 10 years.  You will receive a letter within 1-2 weeks with the results of your biopsy as well as final recommendations.  Please call my office if you have not received a letter after 3 weeks.  eSigned:  Milus Banister, MD 05/29/2014 1:58 PM

## 2014-05-29 NOTE — Patient Instructions (Signed)
Impressions/recommendations:  Polyp (handout given)  Repeat colonoscopy pending pathology results.  Start once daily over the counter omeprazole and call office in 5-6 weeks to report on response.  YOU HAD AN ENDOSCOPIC PROCEDURE TODAY AT [facility]:   Refer to the procedure report that was given to you for any specific questions about what was found during the examination.  If the procedure report does not answer your questions, please call your gastroenterologist to clarify.  If you requested that your care partner not be given the details of your procedure findings, then the procedure report has been included in a sealed envelope for you to review at your convenience later.  YOU SHOULD EXPECT: Some feelings of bloating in the abdomen. Passage of more gas than usual.  Walking can help get rid of the air that was put into your GI tract during the procedure and reduce the bloating. If you had a lower endoscopy (such as a colonoscopy or flexible sigmoidoscopy) you may notice spotting of blood in your stool or on the toilet paper. If you underwent a bowel prep for your procedure, you may not have a normal bowel movement for a few days.  Please Note:  You might notice some irritation and congestion in your nose or some drainage.  This is from the oxygen used during your procedure.  There is no need for concern and it should clear up in a day or so.  SYMPTOMS TO REPORT IMMEDIATELY:   Following lower endoscopy (colonoscopy or flexible sigmoidoscopy):  Excessive amounts of blood in the stool  Significant tenderness or worsening of abdominal pains  Swelling of the abdomen that is new, acute  Fever of 100F or higher   Following upper endoscopy (EGD)  Vomiting of blood or coffee ground material  New chest pain or pain under the shoulder blades  Painful or persistently difficult swallowing  New shortness of breath  Fever of 100F or higher  Black, tarry-looking stools  For  urgent or emergent issues, a gastroenterologist can be reached at any hour by calling [phone].   DIET: Your first meal following the procedure should be a small meal and then it is ok to progress to your normal diet. Heavy or fried foods are harder to digest and may make you feel nauseous or bloated.  Likewise, meals heavy in dairy and vegetables can increase bloating.  Drink plenty of fluids but you should avoid alcoholic beverages for 24 hours.  ACTIVITY:  You should plan to take it easy for the rest of today and you should NOT DRIVE or use heavy machinery until tomorrow (because of the sedation medicines used during the test).    FOLLOW UP: Our staff will call the number listed on your records the next business day following your procedure to check on you and address any questions or concerns that you may have regarding the information given to you following your procedure. If we do not reach you, we will leave a message.  However, if you are feeling well and you are not experiencing any problems, there is no need to return our call.  We will assume that you have returned to your regular daily activities without incident.  If any biopsies were taken you will be contacted by phone or by letter within the next 1-3 weeks.  Please call us at [phone] if you have not heard about the biopsies in 3 weeks.    SIGNATURES/CONFIDENTIALITY: You and/or your care partner have signed  paperwork which will be entered into your electronic medical record.  These signatures attest to the fact that that the information above on your After Visit Summary has been reviewed and is understood.  Full responsibility of the confidentiality of this discharge information lies with you and/or your care-partner.

## 2014-05-30 ENCOUNTER — Telehealth: Payer: Self-pay | Admitting: *Deleted

## 2014-05-30 NOTE — Telephone Encounter (Signed)
  Follow up Call-  Call back number 05/29/2014  Post procedure Call Back phone  # cell # [phone] or call [phone] to leave VM  Permission to leave phone message Yes     Patient questions:  Do you have a fever, pain , or abdominal swelling? No. Pain Score  0 *  Have you tolerated food without any problems? Yes.    Have you been able to return to your normal activities? Yes.    Do you have any questions about your discharge instructions: Diet   No. Medications  No. Follow up visit  No.  Do you have questions or concerns about your Care? No.  Actions: * If pain score is 4 or above: No action needed, pain <4.

## 2014-06-05 ENCOUNTER — Encounter: Payer: Self-pay | Admitting: Gastroenterology

## 2014-06-07 ENCOUNTER — Telehealth: Payer: Self-pay | Admitting: Family Medicine

## 2014-06-07 NOTE — Telephone Encounter (Signed)
Rec'd from [facility] forward 4 pages to Dr. Etter Sjogren

## 2014-10-18 ENCOUNTER — Other Ambulatory Visit: Payer: Self-pay | Admitting: Obstetrics and Gynecology

## 2014-10-22 LAB — CYTOLOGY - PAP

## 2015-01-22 ENCOUNTER — Encounter: Payer: Self-pay | Admitting: Family Medicine

## 2015-01-23 ENCOUNTER — Other Ambulatory Visit: Payer: Self-pay | Admitting: Family Medicine

## 2015-01-23 MED ORDER — VALACYCLOVIR HCL 1 G PO TABS: 1000.0000 mg | ORAL_TABLET | Freq: Three times a day (TID) | ORAL | Status: DC

## 2015-02-01 ENCOUNTER — Encounter: Payer: Self-pay | Admitting: Family Medicine

## 2015-02-26 ENCOUNTER — Ambulatory Visit (INDEPENDENT_AMBULATORY_CARE_PROVIDER_SITE_OTHER): Payer: BLUE CROSS/BLUE SHIELD | Admitting: Internal Medicine

## 2015-02-26 ENCOUNTER — Encounter: Payer: Self-pay | Admitting: Internal Medicine

## 2015-02-26 ENCOUNTER — Other Ambulatory Visit (HOSPITAL_COMMUNITY)
Admission: RE | Admit: 2015-02-26 | Discharge: 2015-02-26 | Disposition: A | Discharge status: Home / Self Care | Payer: BLUE CROSS/BLUE SHIELD | Source: Ambulatory Visit | Attending: Internal Medicine | Admitting: Internal Medicine

## 2015-02-26 VITALS — BP 108/62 | HR 74 | Temp 99.3°F | Ht 60.0 in | Wt 148.5 lb

## 2015-02-26 DIAGNOSIS — R52 Pain, unspecified: Secondary | ICD-10-CM

## 2015-02-26 DIAGNOSIS — R509 Fever, unspecified: Secondary | ICD-10-CM | POA: Diagnosis not present

## 2015-02-26 DIAGNOSIS — N760 Acute vaginitis: Secondary | ICD-10-CM | POA: Insufficient documentation

## 2015-02-26 DIAGNOSIS — B349 Viral infection, unspecified: Secondary | ICD-10-CM | POA: Diagnosis not present

## 2015-02-26 DIAGNOSIS — N898 Other specified noninflammatory disorders of vagina: Secondary | ICD-10-CM

## 2015-02-26 DIAGNOSIS — N939 Abnormal uterine and vaginal bleeding, unspecified: Secondary | ICD-10-CM

## 2015-02-26 LAB — POCT URINALYSIS DIPSTICK
BILIRUBIN UA: NEGATIVE
Glucose, UA: NEGATIVE
NITRITE UA: NEGATIVE
PH UA: 6
Protein, UA: NEGATIVE
Spec Grav, UA: 1.02
Urobilinogen, UA: 0.2

## 2015-02-26 LAB — URINALYSIS, ROUTINE W REFLEX MICROSCOPIC
Bilirubin Urine: NEGATIVE
Ketones, ur: 15 — AB
Nitrite: NEGATIVE
PH: 6 (ref 5.0–8.0)
SPECIFIC GRAVITY, URINE: 1.01 (ref 1.000–1.030)
Total Protein, Urine: NEGATIVE
UROBILINOGEN UA: 0.2 (ref 0.0–1.0)
Urine Glucose: NEGATIVE

## 2015-02-26 LAB — POCT INFLUENZA A/B
INFLUENZA B, POC: NEGATIVE
Influenza A, POC: NEGATIVE

## 2015-02-26 LAB — POCT URINE PREGNANCY: PREG TEST UR: NEGATIVE

## 2015-02-26 NOTE — Progress Notes (Signed)
Subjective:    Patient ID: Julie Duarte, female    DOB: Feb 12, 1970, 45 y.o.   MRN: [id number]  DOS:  02/26/2015 Type of visit - description : Acute Interval history: Symptoms started yesterday w/ on and off myalgias, some nausea, chills. Last night she had fever on and off throughout the night up to a temperature to 101.0. She also developed cough with very mild sputum production.  Additionally, has developed some vaginal discharge, white in color , some itching. Question of vaginal bleeding (she is not sure if was bleeding from scratching or vaginal bleed per se, asked multiple questions but couldn't get a better answer, history taking challenging). Denies abdominal pain Mild suprapubic discomfort. Not on BCPs, last period around 02/02/2015, history of infertility issues   Review of Systems  Denies sore throat. No sinus pain or congestion  No dysuria or gross hematuria No chest congestion per se. Headache mostly with cough. No dysuria or gross hematuria No past medical history on file.  Past Surgical History  Procedure Laterality Date  . Tonsillectomy and adenoidectomy      Social History   Social History  . Marital Status: Single    Spouse Name: N/A  . Number of Children: 0  . Years of Education: N/A   Occupational History  . 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 Smoking status: Never Smoker   . Smokeless tobacco: Never Used  . Alcohol Use: 0.0 oz/week    0 Standard drinks or equivalent per week     Comment: rare  . Drug Use: No  . Sexual Activity:    Partners: Male    Birth Control/ Protection: None   Other Topics Concern  . Not on file   Social History Narrative   Exercise--  Walks dog,        Medication List       This list is accurate as of: 02/26/15  8:14 PM.  Always use your most recent med list.               valACYclovir 1000 MG tablet  Commonly known as:  VALTREX  Take 1 tablet (1,000 mg total)  by mouth 3 (three) times daily.           Objective:   Physical Exam BP 108/62 mmHg  Pulse 74  Temp(Src) 99.3 F (37.4 C) (Oral)  Ht 5' (1.524 m)  Wt 148 lb 8 oz (67.359 kg)  BMI 29.00 kg/m2  SpO2 98%  LMP 02/02/2015 (Exact Date) General:   Well developed, well nourished . NAD.  HEENT:  Normocephalic . Face symmetric, atraumatic Lungs:  CTA B Normal respiratory effort, no intercostal retractions, no accessory muscle use. Heart: RRR,  no murmur.  no pretibial edema bilaterally  Abdomen:  Not distended, soft, non-tender. No rebound or rigidity.  GU: External examination without rash. Speculum: Vagina with minimal white discharge, no lesions. Cervix looks healthy, no CMT,   Adnexal areas w/o  mass. Skin: Not pale. Not jaundice Neurologic:  alert & oriented X3.  Speech normal, gait appropriate for age and unassisted Psych--  Cognition and judgment appear intact.  Cooperative with normal attention span and concentration.  Behavior appropriate. No anxious or depressed appearing.    Assessment & Plan:   Patient presents with a viral syndrome type of picture and vaginal discharge. UA with trace leukocytes, some blood. No urinary symptoms; UPT negative GU exam no supportive of  PID. Unclear (but doubt) that viral syndrome and GU sx are related  Plan: OTCs for viral syndrome, Monistat. Check a CBC BMP, UA, urine culture, G and C, Wet prep RTC next week if no better , further advise w/ results

## 2015-02-26 NOTE — Patient Instructions (Signed)
Please go to the lab  Rest, fluids , tylenol  If  cough:   Mucinex DM twice a day as needed until better  If  nasal congestion: Use OTC Nasocort or Flonase : 2 nasal sprays on each side of the nose in the morning until you feel better  Use OTC Monistat for one week  Call or come back if not gradually better over the next  Few days   Call anytime if the symptoms are severe, you have high fever, rash, abundant vaginal discharge or bleeding

## 2015-02-26 NOTE — Progress Notes (Signed)
Pre visit review using our clinic review tool, if applicable. No additional management support is needed unless otherwise documented below in the visit note. 

## 2015-02-27 LAB — CBC WITH DIFFERENTIAL/PLATELET
Basophils Absolute: 0 10*3/uL (ref 0.0–0.1)
Basophils Relative: 0 % (ref 0.0–3.0)
Eosinophils Absolute: 0 10*3/uL (ref 0.0–0.7)
Eosinophils Relative: 0.5 % (ref 0.0–5.0)
HCT: 37.2 % (ref 36.0–46.0)
Hemoglobin: 12.4 g/dL (ref 12.0–15.0)
Lymphocytes Relative: 8.1 % — ABNORMAL LOW (ref 12.0–46.0)
Lymphs Abs: 0.6 10*3/uL — ABNORMAL LOW (ref 0.7–4.0)
MCHC: 33.3 g/dL (ref 30.0–36.0)
MCV: 92.8 fl (ref 78.0–100.0)
Monocytes Absolute: 0.1 10*3/uL (ref 0.1–1.0)
Monocytes Relative: 2 % — ABNORMAL LOW (ref 3.0–12.0)
Neutro Abs: 6.5 10*3/uL (ref 1.4–7.7)
Neutrophils Relative %: 89.1 % — ABNORMAL HIGH (ref 43.0–77.0)
Platelets: 324 10*3/uL (ref 150.0–400.0)
RBC: 4.01 Mil/uL (ref 3.87–5.11)
RDW: 14.6 % (ref 11.5–15.5)
WBC: 7.5 10*3/uL (ref 4.0–10.5)
nRBC: 0 /100 WBC (ref 0–4)

## 2015-02-27 LAB — BASIC METABOLIC PANEL
BUN: 7 mg/dL (ref 6–23)
CALCIUM: 8.8 mg/dL (ref 8.4–10.5)
CO2: 24 mEq/L (ref 19–32)
CREATININE: 0.88 mg/dL (ref 0.40–1.20)
Chloride: 102 mEq/L (ref 96–112)
GFR: 73.26 mL/min (ref >60.00)
GLUCOSE: 92 mg/dL (ref 70–99)
Potassium: 3.4 mEq/L — ABNORMAL LOW (ref 3.5–5.1)
Sodium: 136 mEq/L (ref 135–145)

## 2015-02-27 LAB — GC/CHLAMYDIA PROBE AMP
CT Probe RNA: NOT DETECTED
GC Probe RNA: NOT DETECTED

## 2015-02-28 ENCOUNTER — Other Ambulatory Visit: Payer: Self-pay | Admitting: Internal Medicine

## 2015-02-28 LAB — URINE CULTURE: Colony Count: 30000

## 2015-02-28 LAB — CERVICOVAGINAL ANCILLARY ONLY: Wet Prep (BD Affirm): POSITIVE — AB

## 2015-02-28 MED ORDER — METRONIDAZOLE 0.75 % VA GEL: 1.0000 | Freq: Two times a day (BID) | VAGINAL | Status: AC

## 2015-03-15 ENCOUNTER — Other Ambulatory Visit: Payer: Self-pay | Admitting: Obstetrics and Gynecology

## 2015-03-15 DIAGNOSIS — R928 Other abnormal and inconclusive findings on diagnostic imaging of breast: Secondary | ICD-10-CM

## 2015-07-02 ENCOUNTER — Ambulatory Visit
Admission: RE | Admit: 2015-07-02 | Discharge: 2015-07-02 | Disposition: A | Discharge status: Home / Self Care | Payer: BLUE CROSS/BLUE SHIELD | Source: Ambulatory Visit | Attending: Obstetrics and Gynecology | Admitting: Obstetrics and Gynecology

## 2015-07-02 DIAGNOSIS — R928 Other abnormal and inconclusive findings on diagnostic imaging of breast: Secondary | ICD-10-CM

## 2016-03-10 ENCOUNTER — Encounter: Payer: Self-pay | Admitting: Family Medicine

## 2016-03-10 ENCOUNTER — Ambulatory Visit (INDEPENDENT_AMBULATORY_CARE_PROVIDER_SITE_OTHER): Payer: BLUE CROSS/BLUE SHIELD | Admitting: Family Medicine

## 2016-03-10 VITALS — BP 100/62 | HR 62 | Temp 98.2°F | Resp 16 | Ht 60.0 in | Wt 154.2 lb

## 2016-03-10 DIAGNOSIS — R200 Anesthesia of skin: Secondary | ICD-10-CM | POA: Diagnosis not present

## 2016-03-10 DIAGNOSIS — R202 Paresthesia of skin: Secondary | ICD-10-CM | POA: Diagnosis not present

## 2016-03-10 DIAGNOSIS — E079 Disorder of thyroid, unspecified: Secondary | ICD-10-CM

## 2016-03-10 NOTE — Patient Instructions (Signed)
Carpal Tunnel Syndrome Introduction Carpal tunnel syndrome is a condition that causes pain in your hand and arm. The carpal tunnel is a narrow area that is on the palm side of your wrist. Repeated wrist motion or certain diseases may cause swelling in the tunnel. This swelling can pinch the main nerve in the wrist (median nerve). Follow these instructions at home: If you have a splint:  Wear it as told by your doctor. Remove it only as told by your doctor.  Loosen the splint if your fingers:  Become numb and tingle.  Turn blue and cold.  Keep the splint clean and dry. General instructions  Take over-the-counter and prescription medicines only as told by your doctor.  Rest your wrist from any activity that may be causing your pain. If needed, talk to your employer about changes that can be made in your work, such as getting a wrist pad to use while typing.  If directed, apply ice to the painful area:  Put ice in a plastic bag.  Place a towel between your skin and the bag.  Leave the ice on for 20 minutes, 2-3 times per day.  Keep all follow-up visits as told by your doctor. This is important.  Do any exercises as told by your doctor, physical therapist, or occupational therapist. Contact a doctor if:  You have new symptoms.  Medicine does not help your pain.  Your symptoms get worse. This information is not intended to replace advice given to you by your health care provider. Make sure you discuss any questions you have with your health care provider. Document Released: 12/25/2010 Document Revised: 06/13/2015 Document Reviewed: 05/23/2014  2017 [employer]

## 2016-03-10 NOTE — Progress Notes (Signed)
Subjective:    Patient ID: Julie Duarte, female    DOB: May 28, 1970, 46 y.o.   MRN: [id number]   I acted as a Education administrator for Dr. Royden Purl, LPN   Chief Complaint  Patient presents with  . Acute Visit    Hand Pain   The incident occurred more than 1 week ago. The pain is present in the left fingers and right fingers. The pain is moderate. The pain has been constant since the incident. Associated symptoms include tingling. Pertinent negatives include no chest pain.    Patient is in today for c/o pain and tingling in the fingers of both hands for over a month.  History reviewed. No pertinent past medical history.  Past Surgical History:  Procedure Laterality Date  . TONSILLECTOMY AND ADENOIDECTOMY      Family History  Problem Relation Age of Onset  . Breast cancer Maternal Grandmother 90    breast  . Colon cancer Maternal Grandmother     Social History   Social History  . Marital status: Single    Spouse name: N/A  . Number of children: 0  . Years of education: N/A   Occupational History  . 2 jobs [employer]--- [employer] [place] History Main Topics  . Smoking status: Never Smoker  . Smokeless tobacco: Never Used  . Alcohol use 0.0 oz/week     Comment: rare  . Drug use: No  . Sexual activity: Yes    Partners: Male    Birth control/ protection: None   Other Topics Concern  . Not on file   Social History Narrative   Exercise--  Walks dog,    Outpatient Medications Prior to Visit  Medication Sig Dispense Refill  . metroNIDAZOLE (METROGEL VAGINAL) 0.75 % vaginal gel Place 1 Applicatorful vaginally 2 (two) times daily. (Patient not taking: Reported on 03/10/2016) 70 g 0  . valACYclovir (VALTREX) 1000 MG tablet Take 1 tablet (1,000 mg total) by mouth 3 (three) times daily. (Patient not taking: Reported on 03/10/2016) 30 tablet 2   No facility-administered medications prior to visit.     Allergies  Allergen Reactions  .  Penicillins   . Quinolones     Review of Systems  Constitutional: Negative for chills, fever and malaise/fatigue.  HENT: Negative for congestion and hearing loss.   Eyes: Negative for discharge.  Respiratory: Negative for cough, sputum production and shortness of breath.   Cardiovascular: Negative for chest pain, palpitations and leg swelling.  Gastrointestinal: Negative for abdominal pain, blood in stool, constipation, diarrhea, heartburn, nausea and vomiting.  Genitourinary: Negative for dysuria, frequency, hematuria and urgency.  Musculoskeletal: Negative for back pain, falls and myalgias.  Skin: Negative for rash.  Neurological: Positive for tingling. Negative for dizziness, sensory change, loss of consciousness, weakness and headaches.  Endo/Heme/Allergies: Negative for environmental allergies. Does not bruise/bleed easily.  Psychiatric/Behavioral: Negative for depression and suicidal ideas. The patient is not nervous/anxious and does not have insomnia.        Objective:    Physical Exam  Constitutional: She is oriented to person, place, and time. She appears well-developed and well-nourished.  HENT:  Head: Normocephalic and atraumatic.  Eyes: Conjunctivae and EOM are normal.  Neck: Normal range of motion. Neck supple. No JVD present. Carotid bruit is not present. No thyromegaly present.  Cardiovascular: Normal rate, regular rhythm and normal heart sounds.   No murmur heard. Pulmonary/Chest: Effort normal and breath sounds normal. No  respiratory distress. She has no wheezes. She has no rales. She exhibits no tenderness.  Musculoskeletal: She exhibits no edema.  Neurological: She is alert and oriented to person, place, and time. No sensory deficit.  + numbness tingling in both hands with flexion   Psychiatric: She has a normal mood and affect.  Nursing note and vitals reviewed.   BP 100/62 (BP Location: Right Arm, Patient Position: Sitting, Cuff Size: Normal)   Pulse 62    Temp 98.2 F (36.8 C) (Oral)   Resp 16   Ht 5' (1.524 m)   Wt 154 lb 3.2 oz (69.9 kg)   LMP 03/06/2016   SpO2 98%   BMI 30.12 kg/m  Wt Readings from Last 3 Encounters:  03/10/16 154 lb 3.2 oz (69.9 kg)  02/26/15 148 lb 8 oz (67.4 kg)  05/29/14 136 lb (61.7 kg)     Lab Results  Component Value Date   WBC 7.5 02/26/2015   HGB 12.4 02/26/2015   HCT 37.2 02/26/2015   PLT 324.0 02/26/2015   GLUCOSE 92 02/26/2015   CHOL 185 08/25/2013   TRIG 51.0 08/25/2013   HDL 57.80 08/25/2013   LDLCALC 117 (H) 08/25/2013   ALT 10 08/25/2013   AST 16 08/25/2013   NA 136 02/26/2015   K 3.4 (L) 02/26/2015   CL 102 02/26/2015   CREATININE 0.88 02/26/2015   BUN 7 02/26/2015   CO2 24 02/26/2015   TSH 1.860 03/10/2016    Lab Results  Component Value Date   TSH 1.860 03/10/2016   Lab Results  Component Value Date   WBC 7.5 02/26/2015   HGB 12.4 02/26/2015   HCT 37.2 02/26/2015   MCV 92.8 02/26/2015   PLT 324.0 02/26/2015   Lab Results  Component Value Date   NA 136 02/26/2015   K 3.4 (L) 02/26/2015   CO2 24 02/26/2015   GLUCOSE 92 02/26/2015   BUN 7 02/26/2015   CREATININE 0.88 02/26/2015   BILITOT 0.3 08/25/2013   ALKPHOS 44 08/25/2013   AST 16 08/25/2013   ALT 10 08/25/2013   PROT 6.7 08/25/2013   ALBUMIN 3.8 08/25/2013   CALCIUM 8.8 02/26/2015   GFR 73.26 02/26/2015   Lab Results  Component Value Date   CHOL 185 08/25/2013   Lab Results  Component Value Date   HDL 57.80 08/25/2013   Lab Results  Component Value Date   LDLCALC 117 (H) 08/25/2013   Lab Results  Component Value Date   TRIG 51.0 08/25/2013   Lab Results  Component Value Date   CHOLHDL 3 08/25/2013   No results found for: HGBA1C     Assessment & Plan:   Problem List Items Addressed This Visit    None    Visit Diagnoses    Numbness and tingling in both hands    -  Primary   Relevant Orders   TSH+T4F+T3Free (Completed)   Thyroid disease       Relevant Orders   TSH+T4F+T3Free  (Completed)    pt has splints at home to wear If thyroid normal --- consider hand surgeon or CTS?  I am having Ms. Doble maintain her valACYclovir and metroNIDAZOLE.  No orders of the defined types were placed in this encounter. CMA served as Education administrator during this visit. History, Physical and Plan performed by medical provider. Documentation and orders reviewed and attested to.   Ann Held, DO  Patient ID: HAMIDA JIMINEZ, female   DOB: 1970-05-14, 46 y.o.   MRN:  [id number]  

## 2016-03-10 NOTE — Progress Notes (Signed)
Pre visit review using our clinic review tool, if applicable. No additional management support is needed unless otherwise documented below in the visit note. 

## 2016-03-11 ENCOUNTER — Encounter: Payer: Self-pay | Admitting: Family Medicine

## 2016-03-12 LAB — TSH+T4F+T3FREE
Free T4: 0.99 ng/dL (ref 0.82–1.77)
T3, Free: 3 pg/mL (ref 2.0–4.4)
TSH: 1.86 u[IU]/mL (ref 0.450–4.500)

## 2016-03-30 ENCOUNTER — Encounter: Payer: Self-pay | Admitting: Family Medicine

## 2016-03-31 ENCOUNTER — Other Ambulatory Visit: Payer: Self-pay | Admitting: Family Medicine

## 2016-03-31 MED ORDER — VALACYCLOVIR HCL 1 G PO TABS: 1000.0000 mg | ORAL_TABLET | Freq: Three times a day (TID) | ORAL | 2 refills | Status: AC

## 2016-03-31 NOTE — Telephone Encounter (Signed)
Valtrex 1000mg  1 po tid x 10 days

## 2016-04-27 ENCOUNTER — Encounter: Payer: Self-pay | Admitting: Family Medicine

## 2016-11-12 IMAGING — MG 2D DIGITAL DIAGNOSTIC UNILATERAL LEFT MAMMOGRAM WITH CAD AND ADJ
6 series · 6 of 14 positions shown · non-contrast
Comparison: 03/11/2015, 01/23/2014.

CLINICAL DATA: Recall from screening mammography.

EXAM:
2D DIGITAL DIAGNOSTIC UNILATERAL LEFT MAMMOGRAM WITH CAD AND ADJUNCT
TOMO

[L MLO synth-2D]
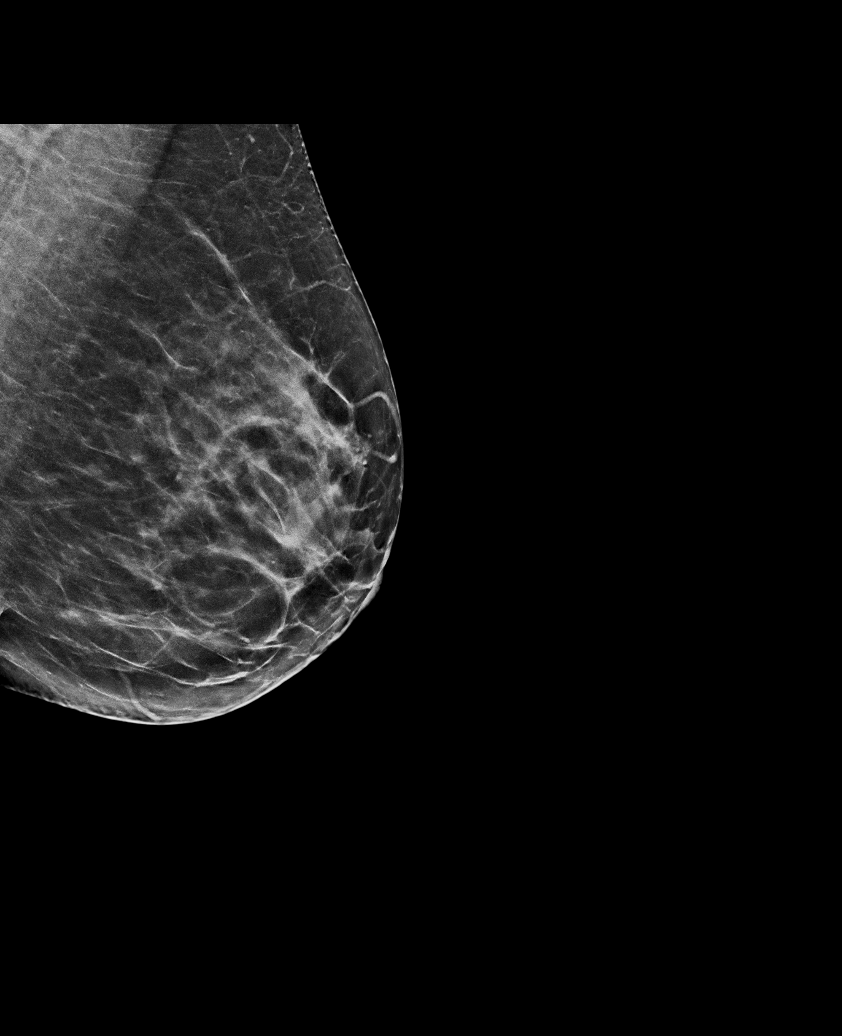

[L CC]
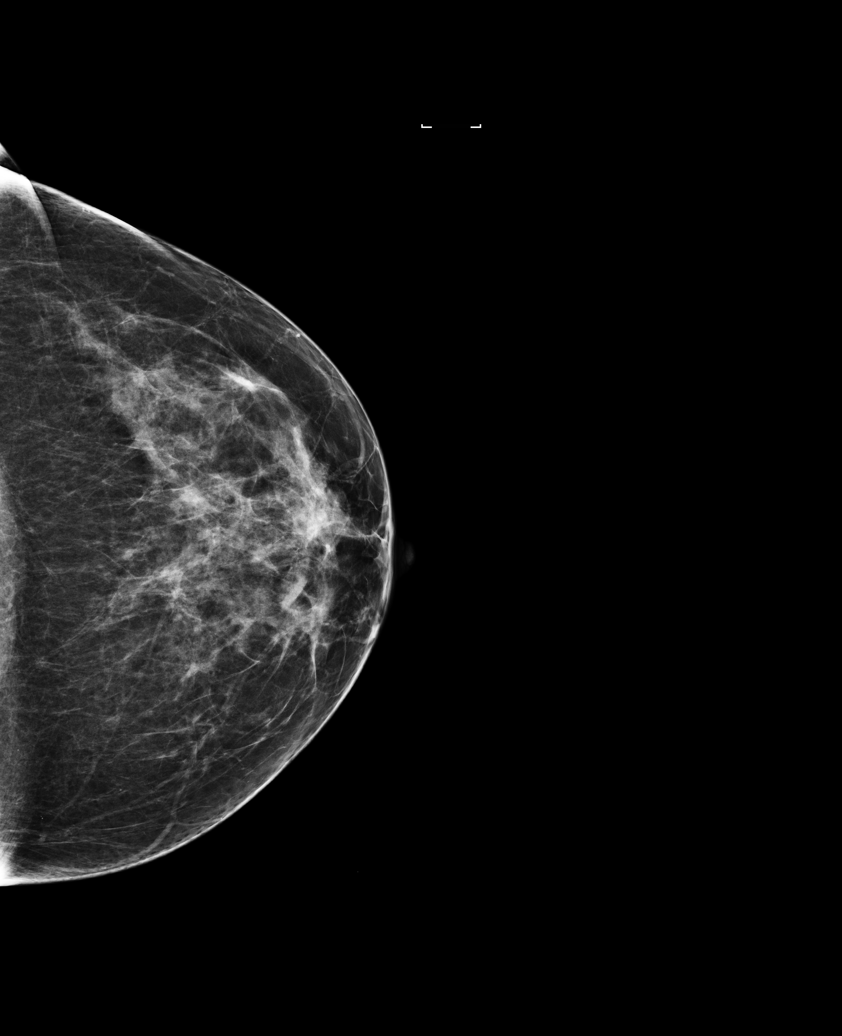

[L CC synth-2D]
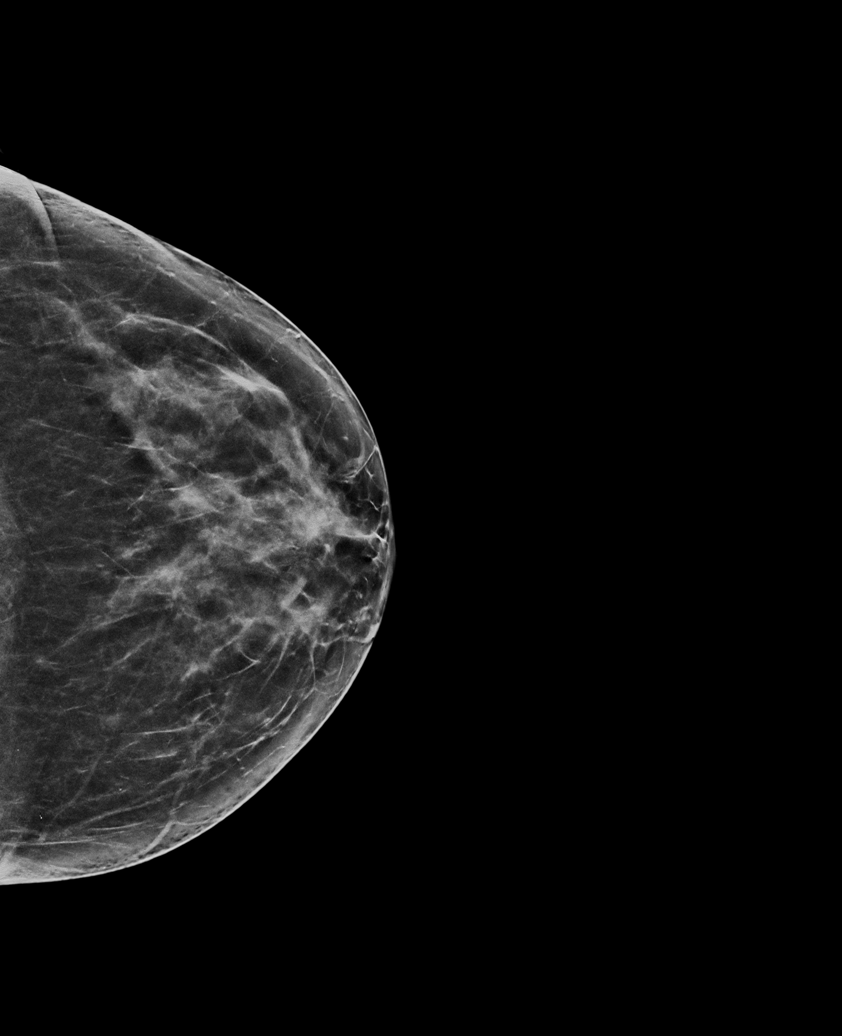

[L MLO]
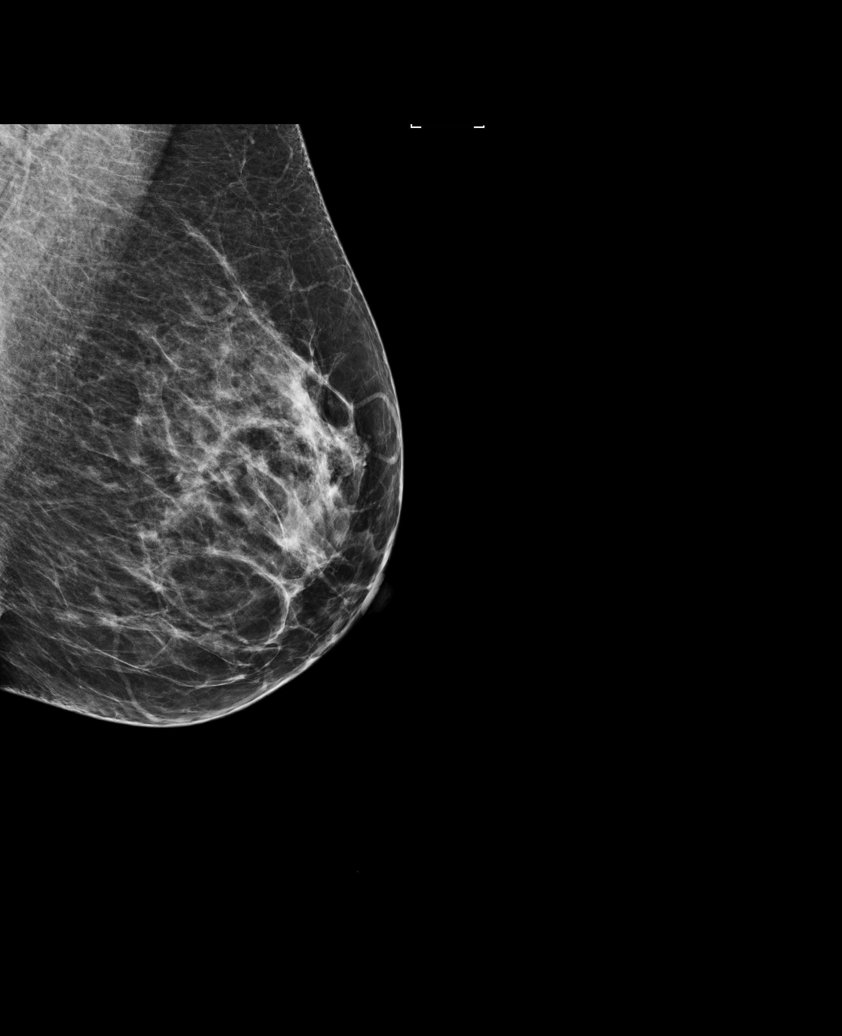

[L MLO tomo · tomo slice 35/68.0]
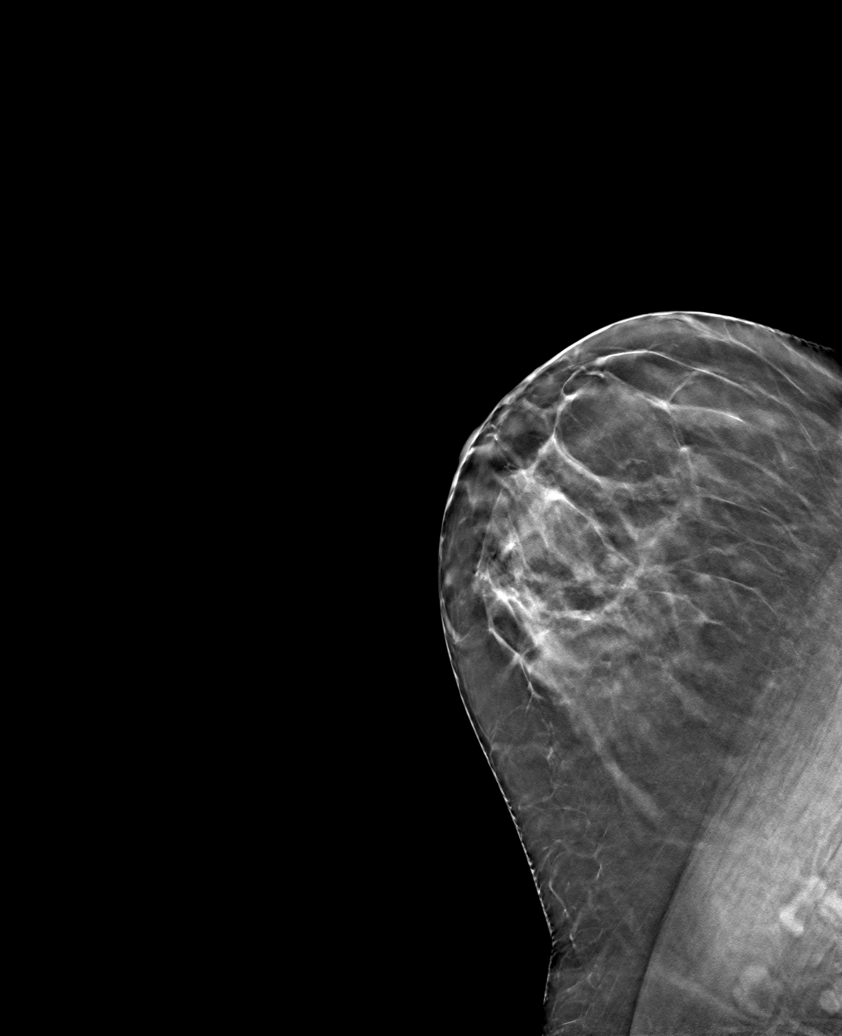

[L CC tomo · tomo slice 32/63.0]
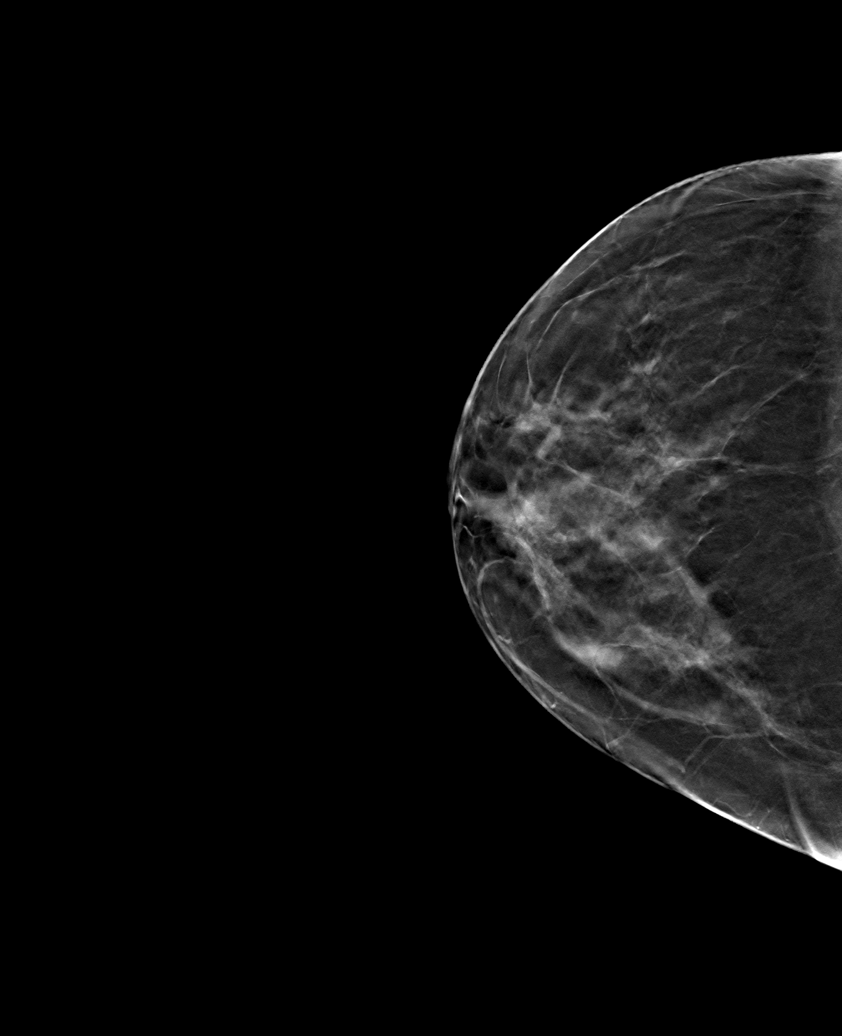

[6 of 14 positions shown; findings below may reference images not displayed]

ACR Breast Density Category b: There are scattered areas of
fibroglandular density.
FINDINGS: Additional views of the left breast with tomosynthesis demonstrate
no persistent worrisome finding. The appearance noted on the
screening study dated 03/11/2015 is consistent with a summation
shadow.

Mammographic images were processed with CAD.
IMPRESSION: No persistent abnormality on additional evaluation the left breast.

RECOMMENDATION:
Screening mammography in February 2016.

I have discussed the findings and recommendations with the patient.
Results were also provided in writing at the conclusion of the
visit. If applicable, a reminder letter will be sent to the patient
regarding the next appointment.

BI-RADS CATEGORY  1: Negative.

## 2016-12-01 ENCOUNTER — Encounter: Payer: Self-pay | Admitting: Family Medicine

## 2016-12-02 NOTE — Telephone Encounter (Signed)
Should could take the valtrex daily to prevent them since they are coming more regularly---  We can try a different one but I have found that valtrex works the best If she wants daily----  We can try 500 mg valtrex daily #30  11 refills----- if that does not seem to be enough we can bump it up to 1000mg  daily

## 2016-12-09 ENCOUNTER — Other Ambulatory Visit: Payer: Self-pay | Admitting: *Deleted

## 2016-12-09 MED ORDER — VALACYCLOVIR HCL 500 MG PO TABS: 500.0000 mg | ORAL_TABLET | Freq: Two times a day (BID) | ORAL | 11 refills | Status: AC

## 2019-04-20 ENCOUNTER — Ambulatory Visit: Payer: BLUE CROSS/BLUE SHIELD | Attending: Internal Medicine

## 2019-04-20 DIAGNOSIS — Z23 Encounter for immunization: Secondary | ICD-10-CM

## 2019-04-20 NOTE — Progress Notes (Signed)
   Covid-19 Vaccination Clinic  Name:  Julie Duarte    MRN: [id number] DOB: 1970/08/09  04/20/2019  [name] was observed post Covid-19 immunization for 15 minutes without incident. She was provided with Vaccine Information Sheet and instruction to access the V-Safe system.   [name] was instructed to call 911 with any severe reactions post vaccine: Marland Kitchen Difficulty breathing  . Swelling of face and throat  . A fast heartbeat  . A bad rash all over body  . Dizziness and weakness   Immunizations Administered    Name Date Dose VIS Date Route   Pfizer COVID-19 Vaccine 04/20/2019 11:03 AM 0.3 mL 12/30/2018 Intramuscular   Manufacturer: [employer], [employer]   Lot: [id number]   [place]: [id number]

## 2019-05-15 ENCOUNTER — Ambulatory Visit: Payer: BLUE CROSS/BLUE SHIELD | Attending: Internal Medicine

## 2019-05-15 DIAGNOSIS — Z23 Encounter for immunization: Secondary | ICD-10-CM

## 2019-05-15 NOTE — Progress Notes (Signed)
   Covid-19 Vaccination Clinic  Name:  Julie Duarte    MRN: [id number] DOB: Mar 02, 1970  05/15/2019  Ms. [name] was observed post Covid-19 immunization for 15 minutes without incident. She was provided with Vaccine Information Sheet and instruction to access the V-Safe system.   Ms. [name] was instructed to call 911 with any severe reactions post vaccine: Marland Kitchen Difficulty breathing  . Swelling of face and throat  . A fast heartbeat  . A bad rash all over body  . Dizziness and weakness   Immunizations Administered    Name Date Dose VIS Date Route   Pfizer COVID-19 Vaccine 05/15/2019 10:28 AM 0.3 mL 03/15/2018 Intramuscular   Manufacturer: [place]   Lot: [id number]   [place]: [id number]
